# Patient Record
Sex: Female | Born: 1980 | Race: Black or African American | Hispanic: No | Marital: Married | Smoking: Current every day smoker
Health system: Southern US, Community
[De-identification: ages and names within clinical notes are randomized; demographics above are authoritative.]

## PROBLEM LIST (undated history)

## (undated) DIAGNOSIS — J40 Bronchitis, not specified as acute or chronic: Secondary | ICD-10-CM

## (undated) DIAGNOSIS — I10 Essential (primary) hypertension: Secondary | ICD-10-CM

## (undated) DIAGNOSIS — I499 Cardiac arrhythmia, unspecified: Secondary | ICD-10-CM

## (undated) HISTORY — PX: TUBAL LIGATION: SHX77

---

## 2001-11-27 ENCOUNTER — Inpatient Hospital Stay (HOSPITAL_COMMUNITY): Admission: AD | Admit: 2001-11-27 | Discharge: 2001-11-27 | Payer: Self-pay | Admitting: Obstetrics and Gynecology

## 2001-11-27 ENCOUNTER — Encounter: Payer: Self-pay | Admitting: Obstetrics and Gynecology

## 2003-09-20 ENCOUNTER — Ambulatory Visit (HOSPITAL_COMMUNITY): Admission: RE | Admit: 2003-09-20 | Discharge: 2003-09-20 | Payer: Self-pay | Admitting: Obstetrics & Gynecology

## 2003-11-10 ENCOUNTER — Inpatient Hospital Stay (HOSPITAL_COMMUNITY): Admission: AD | Admit: 2003-11-10 | Discharge: 2003-11-10 | Payer: Self-pay | Admitting: *Deleted

## 2003-12-08 ENCOUNTER — Inpatient Hospital Stay (HOSPITAL_COMMUNITY): Admission: RE | Admit: 2003-12-08 | Discharge: 2003-12-14 | Payer: Self-pay | Admitting: Obstetrics

## 2003-12-19 ENCOUNTER — Inpatient Hospital Stay (HOSPITAL_COMMUNITY): Admission: AD | Admit: 2003-12-19 | Discharge: 2003-12-29 | Payer: Self-pay | Admitting: Obstetrics

## 2003-12-19 ENCOUNTER — Encounter: Payer: Self-pay | Admitting: Obstetrics & Gynecology

## 2003-12-19 ENCOUNTER — Encounter: Admission: RE | Admit: 2003-12-19 | Discharge: 2003-12-19 | Payer: Self-pay | Admitting: Obstetrics and Gynecology

## 2004-01-10 ENCOUNTER — Inpatient Hospital Stay (HOSPITAL_COMMUNITY): Admission: AD | Admit: 2004-01-10 | Discharge: 2004-01-13 | Payer: Self-pay | Admitting: Obstetrics & Gynecology

## 2004-01-12 ENCOUNTER — Encounter (INDEPENDENT_AMBULATORY_CARE_PROVIDER_SITE_OTHER): Payer: Self-pay | Admitting: Specialist

## 2004-10-31 IMAGING — US US FETAL BPP W/O NONSTRESS
1 series · 12 of 28 positions shown · non-contrast
Comparison: none

CLINICAL DATA: Decreased amniotic fluid index with decels.  Assess growth and fetal well-being.

[Series 1: unknown · 0.32mm/px · 12 of 32 slices shown]
[im 2/32]
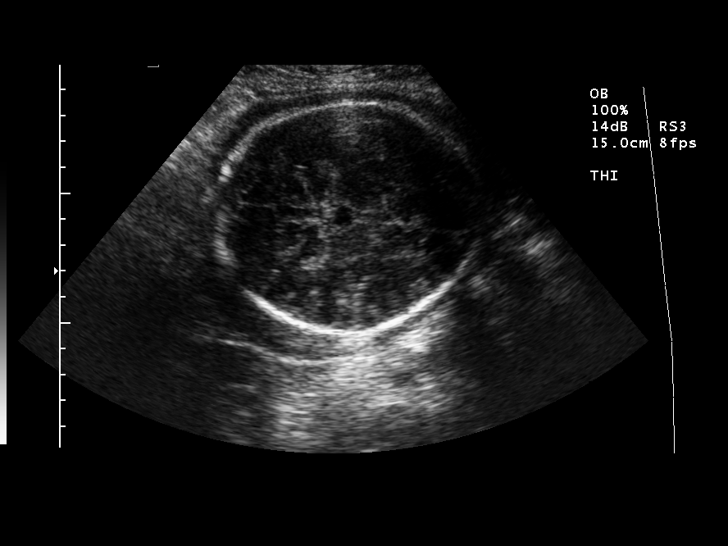
[im 4/32]
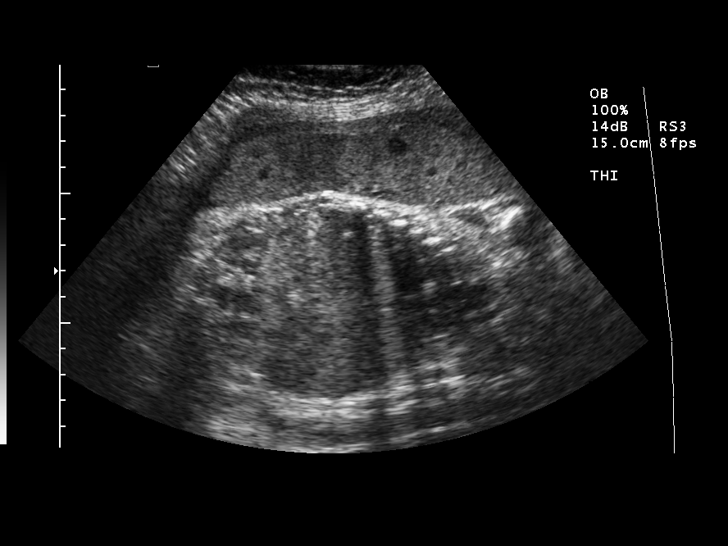
[im 6/32]
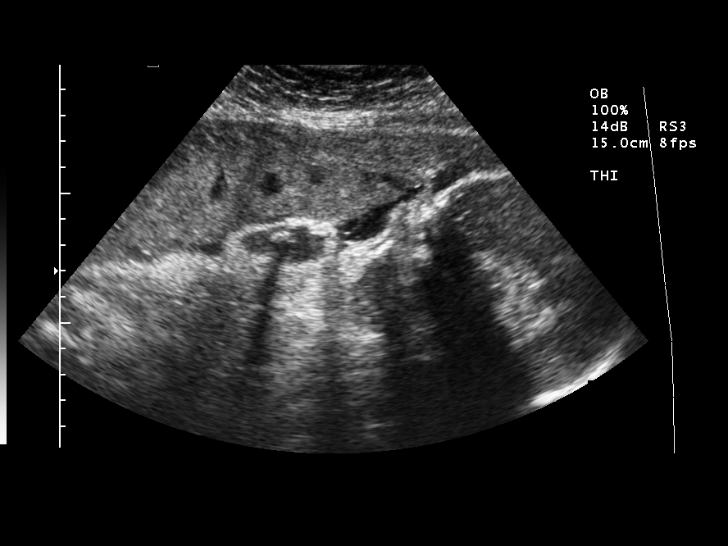
[im 10/32]
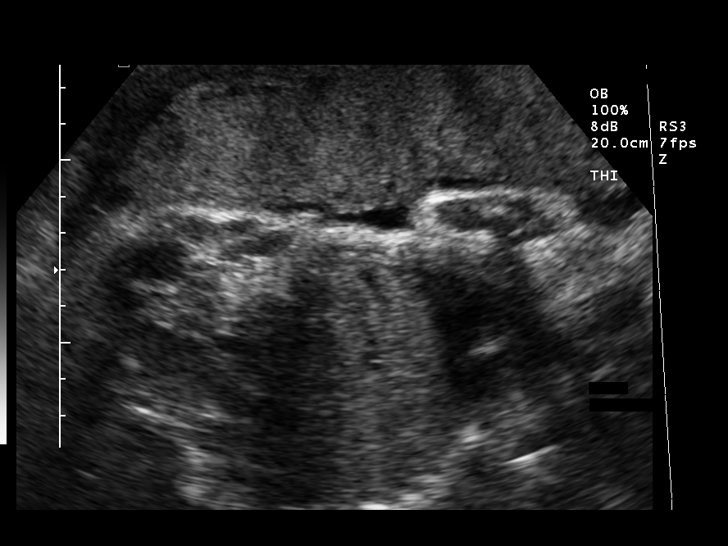
[im 12/32]
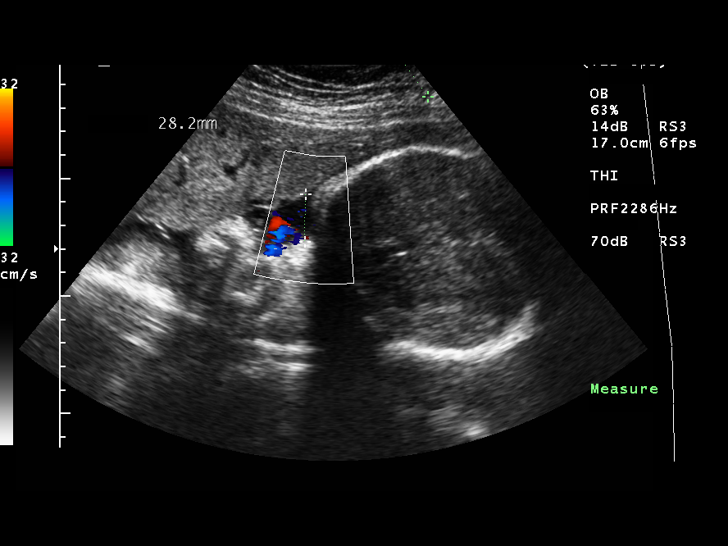
[im 14/32]
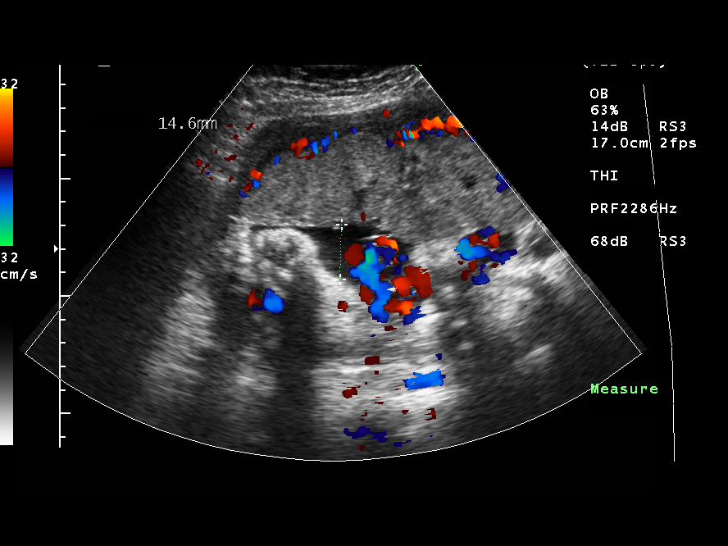
[im 18/32]
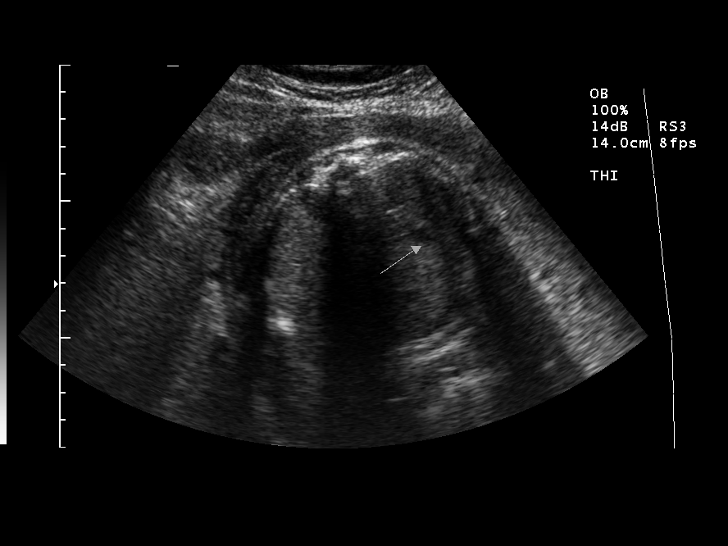
[im 20/32]
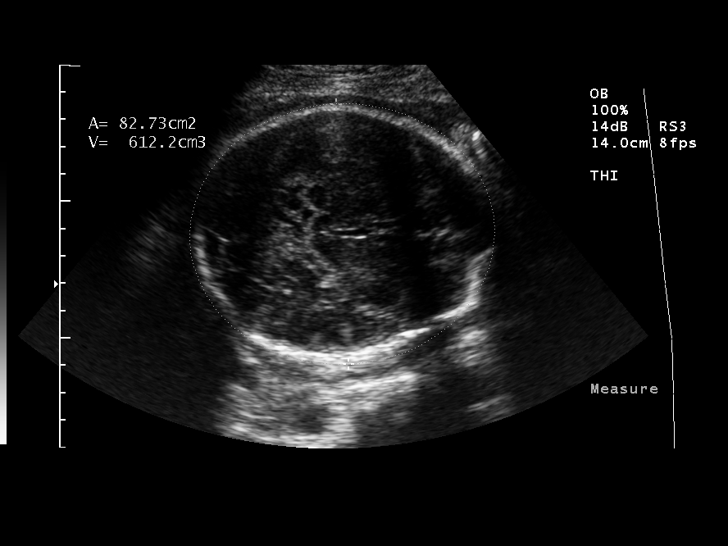
[im 22/32]
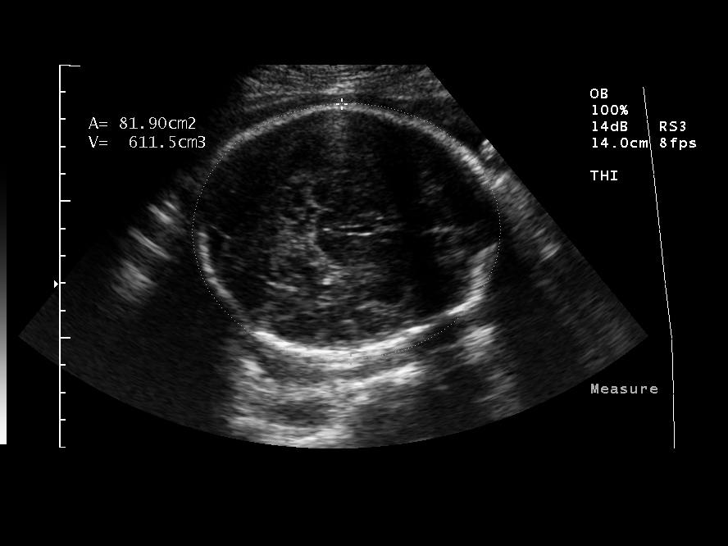
[im 26/32]
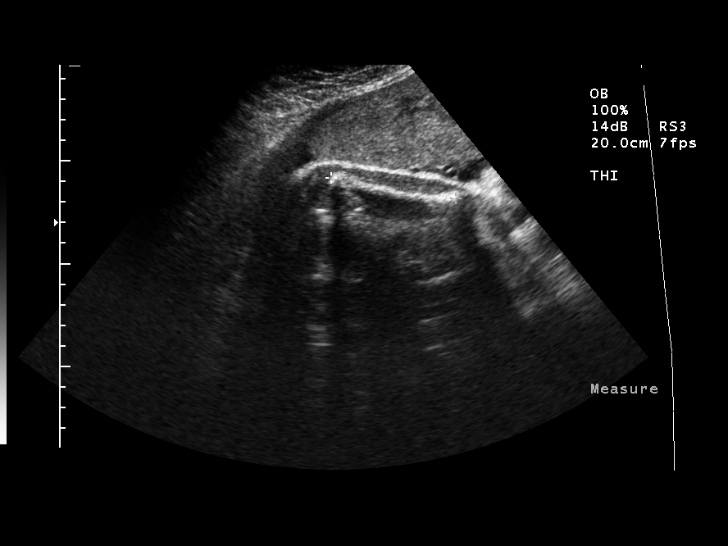
[im 28/32]
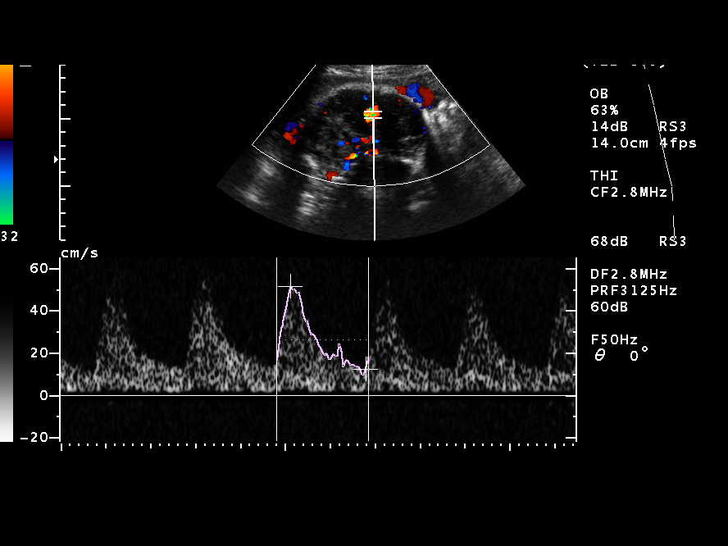
[im 30/32]
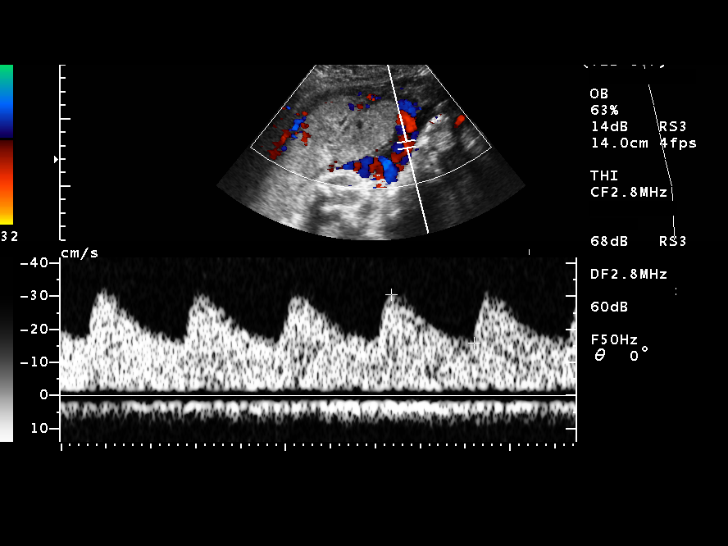

[12 of 28 positions shown; findings below may reference images not displayed]

OBSTETRICAL ULTRASOUND RE-EVALUATION
Number of Fetuses:  1
Heart Rate:  150
Movement:  Yes
Breathing:  Yes
Presentation:  Cephalic
Placental Location:  Anterior
Grade:  I
Previa:  No
Amniotic Fluid (subjective):  Decreased
Amniotic Fluid (objective):  7.3 cm AFI (5th - 95th%ile =   7.5 ? 24.4 cm for 37 wks)

FETAL BIOMETRY
BPD:  9.1 cm     36 w 5 d
HC:  32.4 cm   36 w 4 d
AC:  27.2 cm   31 w 2 d
FL:  6.1 cm   31 w 5 d

Mean GA:  34 w 1 d
Assigned GA:  37 w 1 d (Assigned)
BPD/OFD:  .84 (0.70 ? 0.86); FL/BPD:  .68 (0.71 ? 0.87); FL/AC:  (0.20 ? 0.24); HC/AC:  1.19 (0.92 ? 1.05)

EFW:  8818 g (S) 5th ? 10th%ile ( 3335 ? 4232 g) For 37 weeks

FETAL ANATOMY
Lateral Ventricles:  Visualized 
Thalami/CSP:  Visualized 
Posterior Fossa:  Previously seen 
Nuchal Region:  Previously seen 
Spine:  Previously seen 
4 Chamber Heart on Left:  Previously seen 
Stomach on Left:  Visualized 
3 Vessel Cord:  Previously seen 
Cord Insertion Site:  Previously seen 
Kidneys:  Visualized 
Bladder:  Visualized 
Extremities:  Previously seen 
MATERNAL FINDINGS
Cervix:  Not evaluated
BIOPHYSICAL PROFILE

Movement:  2    Time:  30 minutes
Breathing:  2
Tone: 2
Amniotic Fluid:  2

Total Score:  8

DOPPLER ULTRASOUND OF FETUS

Umbilical Artery S/D Ratio: 2.00    (NL< 3.23)

Middle Cerebral Artery PI:  1.30    (NL> 1.15)
IMPRESSION: Single intrauterine pregnancy demonstrating an estimated gestational age by ultrasound of 34 weeks 1 day.  The abdominal circumference is small in comparison with the head measurements with an absolute measurement of 27.2 cm (31 weeks 2 days).  The femur length is also small relative to the expected estimated gestational age by LMP of 37 weeks 1 day with an absolute measurement of 6.1 cm (31 weeks and 5 days).  The femur is measured at less than the 5th percentile for a 37 week gestation.  For this reason the Shgiwal technique was utilized to estimate weight and this places the estimated fetal weight at 8818 grams which is between the 5th and 10th percentile for a 37 week gestation.  The estimated fetal weight on the most recent growth exam was just at the 10th percentile and this represents a slight interval decrease in overall weight and would again correlate with a small-for-gestational-age fetus or symmetric intrauterine growth restriction.  The presence of a less than 5th percentile femoral length would justify further investigation.  As this study was performed on call measurements of the other long bones was not undertaken, but this would be recommended at subsequent follow-up exam to evaluate for the presence of global limb shortening.  
Subjectively and quantitatively decreased amniotic fluid volume.
No late developing fetal anatomic abnormalities are seen associated with the lateral ventricles, stomach, kidneys or bladder.  A hour chamber heart was not reevaluated today due to positioning.  
Normal fetal doppler evaluation.  
Biophysical profile score [DATE].

## 2008-04-25 ENCOUNTER — Emergency Department (HOSPITAL_COMMUNITY): Admission: EM | Admit: 2008-04-25 | Discharge: 2008-04-25 | Payer: Self-pay | Admitting: Emergency Medicine

## 2008-04-28 ENCOUNTER — Emergency Department (HOSPITAL_COMMUNITY): Admission: EM | Admit: 2008-04-28 | Discharge: 2008-04-28 | Payer: Self-pay | Admitting: Emergency Medicine

## 2009-01-08 ENCOUNTER — Emergency Department (HOSPITAL_COMMUNITY): Admission: EM | Admit: 2009-01-08 | Discharge: 2009-01-08 | Payer: Self-pay | Admitting: Emergency Medicine

## 2010-10-20 ENCOUNTER — Encounter: Payer: Self-pay | Admitting: Obstetrics & Gynecology

## 2011-02-14 NOTE — Discharge Summary (Signed)
NAME:  Melanie Castro, Melanie Castro                    ACCOUNT NO.:  [id number]   MEDICAL RECORD NO.:  [id number]                   PATIENT TYPE:  INP   LOCATION:  9154                                 FACILITY:  [facility]   PHYSICIAN:  Roseanna Rainbow, M.D.         DATE OF BIRTH:  Jan 18, 1981   DATE OF ADMISSION:  12/19/2003  DATE OF DISCHARGE:  12/29/2003                                 DISCHARGE SUMMARY   CHIEF COMPLAINT:  The patient is a 30 year old, para 3, with estimated date  of confinement Jan 30, 2004, with an intrauterine pregnancy at 34 weeks  admitted with borderline low amniotic fluid and a suspicious nonstress test.  Please see the dictated History and Physical for further details.   HOSPITAL COURSE:  The patient was admitted and initially placed on  continuous external fetal monitoring and bed rest.  The fetal surveillance  was then changed to non-stress test twice a day.  On April 1, an ultrasound  demonstrated an amniotic fluid index of 8.5 and estimated fetal weight  percentile of 10th to 25th percentile and biophysical profile of 8/8 and  normal Doppler studies.  The fetal heart tracing remained reassuring.  There  had not been any decelerations for 2 days prior to 4/1.  At this point, the  patient was discharged to home.  She was counseled regarding the importance  of twice daily kick counts and modified bed rest.  She was also counseled  regarding the importance of biweekly followup in the office with serial  fetal testing and ultrasounds.   DISCHARGE DIAGNOSIS:  Intrauterine pregnancy at 35-plus weeks with  borderline decreased fetal growth, borderline low amniotic fluid index.   CONDITION:  Stable.   DIET:  Regular.   ACTIVITY:  Modified bed rest.   MEDICATIONS:  Resume home medications.   DISPOSITION:  The patient was to follow up in the office in 2 days.                                               Roseanna Rainbow, M.D.    Melanie Castro  D:  01/28/2004   T:  01/29/2004  Job:  [id number]

## 2011-02-14 NOTE — Op Note (Signed)
NAME:  Melanie Castro, Melanie Castro                    ACCOUNT NO.:  [id number]   MEDICAL RECORD NO.:  [id number]                   PATIENT TYPE:  INP   LOCATION:  9102                                 FACILITY:  [facility]   PHYSICIAN:  Roseanna Rainbow, M.D.         DATE OF BIRTH:  10/14/1980   DATE OF PROCEDURE:  01/12/2004  DATE OF DISCHARGE:                                 OPERATIVE REPORT   PREOPERATIVE DIAGNOSIS:  Multiparity, desires sterilization.   POSTOPERATIVE DIAGNOSIS:  Multiparity, desires sterilization, right  paratubal cyst.   PROCEDURE:  Modified Pomeroy bilateral tubal ligation and excision of right  paratubal cyst.   SURGEON:  Roseanna Rainbow, M.D.   ANESTHESIA:  Spinal.   ESTIMATED BLOOD LOSS:  Less than 50 mL.   COMPLICATIONS:  None.   PROCEDURE:  The patient was taken to the operating room and spinal  anesthetic was administered without difficulty.  She was then placed in the  dorsal supine position and prepped and draped in the sterile fashion.  A  small infraumbilical incision was made with the scalpel and carried down to  the fascia.  The fascia was then incised and this incision was extended  bilaterally with Mayo scissors.  The parietal peritoneum was then grasped,  tented up, and entered sharply with Metzenbaum scissors.  The incision was  extended and the peritoneal incision was palpated and felt to be free of any  adhesions.  Army-[employer]y retractors were then placed into the incision.  The  patient's right fallopian tube was then grasped with Babcock clamps and  followed out to the fimbriated end.  Coming off the ampullary portion of the  tube was a multiloculated paratubal cyst.  The mid isthmic portion of the  tube was then grasped with the Babcock clamp.  Two ligatures of 0 plain were  placed and the intervening segment of tube excised.  The base of the  paratubal cyst was clamped with a hemostat.  The cyst was excised.  The base  was then suture  ligated with 2-0 Vicryl.  Excellent hemostasis was noted and  the tube was returned to the abdomen.  The left fallopian tube was then  grasped with Babcock clamps and followed out to the fimbriated end.  The mid  isthmic portion of the tube was then grasped with the Babcock clamp.  Two  ligatures of 0 plain were placed and the intervening segment of tube  excised.  Adequate hemostasis was noted and the tube was returned to the  abdomen.  The parietal peritoneum and fascia were closed in one layer using  0 Vicryl in a running fashion.  The skin was reapproximated with 4-0 Vicryl.  At the close of the procedure, the instrument and pack counts were said to  be correct x 2.  The patient was taken to the PACU awake and in stable  condition.  Roseanna Rainbow, M.D.    Judee Clara  D:  01/12/2004  T:  01/12/2004  Job:  [id number]

## 2011-02-14 NOTE — H&P (Signed)
NAME:  Melanie Castro, Melanie Castro                     ACCOUNT NO.:  [id number]   MEDICAL RECORD NO.:  [id number]                   PATIENT TYPE:  INP   LOCATION:  9154                                 FACILITY:  [facility]   PHYSICIAN:  Roseanna Rainbow, M.D.         DATE OF BIRTH:  1981/06/01   DATE OF ADMISSION:  12/19/2003  DATE OF DISCHARGE:                                HISTORY & PHYSICAL   REASON FOR ADMISSION:  The patient is a 30 year old para 3 with estimated  date of confinement Jan 30, 2004 with an intrauterine pregnancy at 34 weeks  admitted with borderline low amniotic fluid index and suspicious nonstress  test.   HISTORY OF PRESENT ILLNESS:  As above.  The patient had previously been  admitted with similar issues approximately 1 week ago.  She presented for a  biophysical profile and was found to have a BPP of 6/8, -2 for breathing.  She was then brought for a nonstress test and during the nonstress test had  a spontaneous prolonged deceleration with nadir in the 60s for 2-3 minutes.  Amniotic fluid index today was 7.1.   ANTEPARTUM COURSE, PROBLEMS, AND RISKS:  See above.  She has a history of  depression and pregnancy-induced hypertension with a previous pregnancy.  Most recent ultrasound on March 11 demonstrated an estimated fetal weight  percentile at the 10th percentile with abdominal circumference lag  consistent with asymmetrical intrauterine growth restriction.  Doppler  studies were normal.   LABORATORY WORK:  Hemoglobin and hematocrit 11.4 and 36.1; platelets  308,000.  Blood type A positive, Rh antibody negative.  Sickle cell trait  negative.  RPR nonreactive.  Rubella immune.  Hepatitis B surface antigen  negative.  HIV nonreactive.  Gonorrhea and chlamydia negative.  Urine  culture and sensitivity negative.   PAST OBSTETRICAL AND GYNECOLOGICAL HISTORY:  She is status post three SVDs  at term.  She has history of Trichomonas.   PAST MEDICAL HISTORY:  See above.   The patient has history of  pyelonephritis.   PAST SURGICAL HISTORY:  She denies.   FAMILY HISTORY:  Remarkable for chronic hypertension.   SOCIAL HISTORY:  She denies any tobacco, ethanol, or substance abuse.   ALLERGIES:  No known drug allergies.   MEDICATIONS:  Prenatal vitamins.   PHYSICAL EXAMINATION:  VITAL SIGNS:  Stable, afebrile.  Nonstress test - see  above.  ABDOMEN:  Nontender.  PELVIC:  Deferred.   ASSESSMENT:  Intrauterine pregnancy at 34 weeks with intrauterine growth  restriction borderline and low amniotic fluid index in a suspicious fetal  heart tracing.   PLAN:  1. Admission.  2. Continuous fetal monitoring.  3. Serial amniotic fluid index checks as well as Dopplers.  4. Bedrest.  Roseanna Rainbow, M.D.    Judee Clara  D:  12/20/2003  T:  12/20/2003  Job:  [id number]

## 2011-02-14 NOTE — Discharge Summary (Signed)
NAME:  Melanie Castro, Melanie Castro                    ACCOUNT NO.:  [id number]   MEDICAL RECORD NO.:  [id number]                   PATIENT TYPE:  INP   LOCATION:  9158                                 FACILITY:  [facility]   PHYSICIAN:  Roseanna Rainbow, M.D.         DATE OF BIRTH:  June 28, 1981   DATE OF ADMISSION:  12/08/2003  DATE OF DISCHARGE:  12/14/2003                                 DISCHARGE SUMMARY   CHIEF COMPLAINT:  The patient is a 30 year old gravida 4, para 3, with an  intrauterine pregnancy at 32 weeks and found to have an amniotic fluid index  of 8.   HISTORY OF PRESENT ILLNESS:  The patient has presented for an ultrasound for  interval growth.  The amniotic fluid index was found to be 8 and the  estimated fetal weight percentile found to be between the 10th and the 25th  percentile.   PAST SURGICAL HISTORY:  She denies.   PAST MEDICAL HISTORY:  Hypertension, heart murmur, sickle cell trait,  anxiety disorder, pyelonephritis.   MEDICATIONS:  Prenatal vitamins.   ALLERGIES:  No known drug allergies.   SOCIAL HISTORY:  She is single.  She denies any tobacco, ethanol, or  substance abuse.   PHYSICAL EXAMINATION:  VITAL SIGNS:  Stable, afebrile.  ABDOMEN:  Gravid, nontender.   ASSESSMENT:  Intrauterine pregnancy at 32-plus weeks, borderline low  amniotic fluid index and borderline decreased fetal growth.   PLAN:  Admission for bedrest.   HOSPITAL COURSE:  The patient was admitted.  The fetal heart tracing was  noted to have several prolonged decelerations for 2-3 minutes nadiring in  the 100s on March 15.  However, the fetal heart tracing strips were reactive  with good long-term variability.  On March 17, an ultrasound demonstrated  amniotic fluid index of 10.4 with normal Doppler studies.  She was  subsequently discharged to home.  She was counseled regarding the need for  twice daily kick counts and biweekly testing and to continue modified bed  rest.   DISCHARGE  DIAGNOSIS:  Intrauterine pregnancy at 33-plus weeks with  borderline decreased fetal growth and fetal heart tracing with intermittent  variable decelerations.   CONDITION:  Stable.   DIET:  Regular.   ACTIVITY:  Modified bedrest.   MEDICATIONS:  Resume home medications.   DISPOSITION:  The patient was to follow up in the office in 2 days.                                               Roseanna Rainbow, M.D.    Judee Clara  D:  01/28/2004  T:  01/29/2004  Job:  [id number]

## 2012-07-28 ENCOUNTER — Emergency Department (HOSPITAL_COMMUNITY)
Admission: EM | Admit: 2012-07-28 | Discharge: 2012-07-28 | Disposition: A | Discharge status: Home / Self Care | Payer: Self-pay | Attending: Emergency Medicine | Admitting: Emergency Medicine

## 2012-07-28 ENCOUNTER — Emergency Department (HOSPITAL_COMMUNITY): Payer: Self-pay

## 2012-07-28 ENCOUNTER — Encounter (HOSPITAL_COMMUNITY): Payer: Self-pay | Admitting: Emergency Medicine

## 2012-07-28 DIAGNOSIS — I10 Essential (primary) hypertension: Secondary | ICD-10-CM | POA: Insufficient documentation

## 2012-07-28 DIAGNOSIS — R1110 Vomiting, unspecified: Secondary | ICD-10-CM | POA: Insufficient documentation

## 2012-07-28 DIAGNOSIS — J45909 Unspecified asthma, uncomplicated: Secondary | ICD-10-CM | POA: Insufficient documentation

## 2012-07-28 DIAGNOSIS — R51 Headache: Secondary | ICD-10-CM | POA: Insufficient documentation

## 2012-07-28 DIAGNOSIS — J069 Acute upper respiratory infection, unspecified: Secondary | ICD-10-CM | POA: Insufficient documentation

## 2012-07-28 DIAGNOSIS — R109 Unspecified abdominal pain: Secondary | ICD-10-CM | POA: Insufficient documentation

## 2012-07-28 DIAGNOSIS — IMO0001 Reserved for inherently not codable concepts without codable children: Secondary | ICD-10-CM | POA: Insufficient documentation

## 2012-07-28 DIAGNOSIS — Z79899 Other long term (current) drug therapy: Secondary | ICD-10-CM | POA: Insufficient documentation

## 2012-07-28 DIAGNOSIS — F17200 Nicotine dependence, unspecified, uncomplicated: Secondary | ICD-10-CM | POA: Insufficient documentation

## 2012-07-28 HISTORY — DX: Essential (primary) hypertension: I10

## 2012-07-28 MED ORDER — HYDROCOD POLST-CHLORPHEN POLST 10-8 MG/5ML PO LQCR: 5.0000 mL | Freq: Two times a day (BID) | ORAL | Status: DC

## 2012-07-28 MED ORDER — IBUPROFEN 800 MG PO TABS
800.0000 mg | ORAL_TABLET | Freq: Once | ORAL | Status: AC
  Administered 2012-07-28: 800 mg via ORAL
  Filled 2012-07-28: qty 1

## 2012-07-28 MED ORDER — GUAIFENESIN ER 600 MG PO TB12: 1200.0000 mg | ORAL_TABLET | Freq: Two times a day (BID) | ORAL | Status: DC

## 2012-07-28 MED ORDER — ALBUTEROL SULFATE HFA 108 (90 BASE) MCG/ACT IN AERS: 1.0000 | INHALATION_SPRAY | Freq: Four times a day (QID) | RESPIRATORY_TRACT | Status: DC | PRN

## 2012-07-28 MED ORDER — ALBUTEROL SULFATE (5 MG/ML) 0.5% IN NEBU: 2.5000 mg | INHALATION_SOLUTION | Freq: Once | RESPIRATORY_TRACT | Status: DC

## 2012-07-28 MED ORDER — IBUPROFEN 800 MG PO TABS: 800.0000 mg | ORAL_TABLET | Freq: Three times a day (TID) | ORAL | Status: DC | PRN

## 2012-07-28 MED ORDER — ALBUTEROL SULFATE HFA 108 (90 BASE) MCG/ACT IN AERS
2.0000 | INHALATION_SPRAY | Freq: Once | RESPIRATORY_TRACT | Status: AC
  Administered 2012-07-28: 2 via RESPIRATORY_TRACT
  Filled 2012-07-28: qty 6.7

## 2012-07-28 NOTE — ED Provider Notes (Addendum)
History     CSN: [id number]  Arrival date & time 07/28/12  4098   First MD Initiated Contact with Patient 07/28/12 [phone]      Chief Complaint  Patient presents with  . Cough  . Emesis    (Consider location/radiation/quality/duration/timing/severity/associated sxs/prior treatment) HPI Comments: Melanie Castro 31 y.o. female   The chief complaint is: Patient presents with:   Cough   Emesis    Awoke yesterday with, cough, runny nose , cp,  Post- tussive vomiting, headache. Took alka seltzer plus cold and benadryl without relief.  + elevated temperature, myalgias, arthralgias.  Non-productive cough.  +chest congestion. Ear pain. + muscular soreness chest and abdomen Denies, sore throat. Otalgia,   Patient is a 31 y.o. female presenting with cough and vomiting. The history is provided by the patient. No language interpreter was used.  Cough Associated symptoms include chest pain (wiith cough), chills, headaches, rhinorrhea, myalgias and wheezing. Pertinent negatives include no shortness of breath.  Emesis  Associated symptoms include abdominal pain, arthralgias, chills, cough, a fever, headaches and myalgias.    Past Medical History  Diagnosis Date  . Hypertension     Past Surgical History  Procedure Date  . Tubal ligation     Family History  Problem Relation Age of Onset  . Hypertension Mother   . Diabetes Other     History  Substance Use Topics  . Smoking status: Current Every Day Smoker -- 2.0 packs/day    Types: Cigarettes  . Smokeless tobacco: Not on file  . Alcohol Use: Yes    OB History    Grav Para Term Preterm Abortions TAB SAB Ect Mult Living                  Review of Systems  Constitutional: Positive for fever, chills, activity change and appetite change. Negative for diaphoresis.  HENT: Positive for congestion and rhinorrhea. Negative for trouble swallowing, dental problem, voice change and sinus pressure.   Respiratory: Positive  for cough, chest tightness and wheezing. Negative for shortness of breath.   Cardiovascular: Positive for chest pain (wiith cough).  Gastrointestinal: Positive for vomiting and abdominal pain.  Genitourinary: Negative.   Musculoskeletal: Positive for myalgias and arthralgias.  Skin: Negative for rash.  Neurological: Positive for headaches. Negative for seizures, syncope and light-headedness.    Allergies  Amoxicillin and Penicillins  Home Medications   Current Outpatient Rx  Name Route Sig Dispense Refill  . ALKA-SELTZER PLUS COLD PO Oral Take 1 tablet by mouth every 6 (six) hours.    Marland Kitchen DIPHENHYDRAMINE HCL 25 MG PO CAPS Oral Take 25 mg by mouth every 6 (six) hours as needed. allergies    . HALLS COUGH DROPS 5 MG MT LOZG Mouth/Throat Use as directed 1 lozenge in the mouth or throat as needed. cough      BP 134/83  Pulse 100  Temp 100.2 F (37.9 C) (Oral)  Resp 26  SpO2 99%  LMP 07/21/2012  Physical Exam  Nursing note and vitals reviewed. Constitutional: She is oriented to person, place, and time. No distress.       Ill appearing female, glassy eyes. Warm to touch.  HENT:  Head: Normocephalic.       Mild pharyngeal erythema, no swelling or exudates.   Eyes: Conjunctivae normal are normal.  Neck: Normal range of motion. Neck supple. No tracheal deviation present. No thyromegaly present.  Cardiovascular: Regular rhythm, normal heart sounds and intact distal pulses.  Broderline tachycardia. Patient is febrile.   Pulmonary/Chest: Effort normal.       Mild tachypnea. Normal effort.  02 sats 100% RA on monitor.  Mild expiratory wheezes. Normal expiratory phase.  Abdominal: Soft. Bowel sounds are normal. She exhibits no distension and no mass. There is no tenderness. There is no guarding.  Musculoskeletal: Normal range of motion.  Lymphadenopathy:    She has cervical adenopathy.  Neurological: She is alert and oriented to person, place, and time.  Skin: Skin is warm and  dry. No rash noted. She is not diaphoretic.    ED Course  Procedures (including critical care time)  Labs Reviewed - No data to display Dg Chest 2 View  07/28/2012  *RADIOLOGY REPORT*  Clinical Data: Cough, shortness of breath, chest pain.  CHEST - 2 VIEW  Comparison: None.  Findings: Heart and mediastinal contours are within normal limits. No focal opacities or effusions.  No acute bony abnormality.  IMPRESSION: No active cardiopulmonary disease.   Original Report Authenticated By: Cyndie Chime, M.D.      1. URI (upper respiratory infection)   2. Reactive airway disease       MDM  Patient with sxs consistent with viral URI. cxr negative. i will discharge with albuterol inhaler and  supportive care. Discussed return precautions. Discussed reasons to seek immediate care. Patient expresses understanding and agrees with plan.          Arthor Captain, PA-C 07/28/12 1649  Arthor Captain, PA-C 08/10/12 1041

## 2012-07-28 NOTE — ED Notes (Signed)
Pt states that she is congested and has a dry cough since yesterday.  States that she was coughing so much that she has been throwing up.  Has been taking vicks, benadryl, and alkaseltzer.

## 2012-07-29 NOTE — ED Provider Notes (Signed)
Medical screening examination/treatment/procedure(s) were performed by non-physician practitioner and as supervising physician I was immediately available for consultation/collaboration.   Celene Kras, MD 07/29/12 [phone]

## 2013-05-16 ENCOUNTER — Emergency Department (HOSPITAL_COMMUNITY): Payer: Self-pay

## 2013-05-16 ENCOUNTER — Emergency Department (HOSPITAL_COMMUNITY)
Admission: EM | Admit: 2013-05-16 | Discharge: 2013-05-16 | Disposition: A | Discharge status: Home / Self Care | Payer: Self-pay | Attending: Emergency Medicine | Admitting: Emergency Medicine

## 2013-05-16 ENCOUNTER — Encounter (HOSPITAL_COMMUNITY): Payer: Self-pay | Admitting: Emergency Medicine

## 2013-05-16 DIAGNOSIS — I10 Essential (primary) hypertension: Secondary | ICD-10-CM | POA: Insufficient documentation

## 2013-05-16 DIAGNOSIS — Z880 Allergy status to penicillin: Secondary | ICD-10-CM | POA: Insufficient documentation

## 2013-05-16 DIAGNOSIS — F17200 Nicotine dependence, unspecified, uncomplicated: Secondary | ICD-10-CM | POA: Insufficient documentation

## 2013-05-16 DIAGNOSIS — R0789 Other chest pain: Secondary | ICD-10-CM

## 2013-05-16 DIAGNOSIS — Z79899 Other long term (current) drug therapy: Secondary | ICD-10-CM | POA: Insufficient documentation

## 2013-05-16 DIAGNOSIS — R071 Chest pain on breathing: Secondary | ICD-10-CM | POA: Insufficient documentation

## 2013-05-16 MED ORDER — IBUPROFEN 800 MG PO TABS: 800.0000 mg | ORAL_TABLET | Freq: Three times a day (TID) | ORAL | Status: DC

## 2013-05-16 MED ORDER — IBUPROFEN 800 MG PO TABS
800.0000 mg | ORAL_TABLET | Freq: Once | ORAL | Status: AC
  Administered 2013-05-16: 800 mg via ORAL
  Filled 2013-05-16: qty 1

## 2013-05-16 NOTE — ED Provider Notes (Signed)
CSN: [id number]     Arrival date & time 05/16/13  0636 History     First MD Initiated Contact with Patient 05/16/13 [phone]     Chief Complaint  Patient presents with  . Flank Pain   (Consider location/radiation/quality/duration/timing/severity/associated sxs/prior Treatment) Patient is a 32 y.o. female presenting with flank pain and chest pain.  Flank Pain Associated symptoms include chest pain. Pertinent negatives include no abdominal pain.  Chest Pain Pain location:  L lateral chest Pain quality: sharp   Pain quality: not aching and not burning   Pain radiates to:  Does not radiate Pain radiates to the back: no   Pain severity:  Mild Onset quality:  Gradual Duration:  2 weeks Timing:  Intermittent Progression:  Unchanged Chronicity:  New Context: breathing (deep breaths)   Context comment:  Twisting, palpation Relieved by:  Nothing Worsened by:  Nothing tried Ineffective treatments:  None tried Associated symptoms: no abdominal pain, no back pain, no fever, no lower extremity edema, no nausea, no syncope and not vomiting   Risk factors: hypertension and smoking   Risk factors: no birth control, no diabetes mellitus, not female and no prior DVT/PE     Past Medical History  Diagnosis Date  . Hypertension    Past Surgical History  Procedure Laterality Date  . Tubal ligation     Family History  Problem Relation Age of Onset  . Hypertension Mother   . Diabetes Other    History  Substance Use Topics  . Smoking status: Current Every Day Smoker -- 2.00 packs/day    Types: Cigarettes  . Smokeless tobacco: Not on file  . Alcohol Use: Yes   OB History   Grav Para Term Preterm Abortions TAB SAB Ect Mult Living                 Review of Systems  Constitutional: Negative for fever.  Cardiovascular: Positive for chest pain. Negative for syncope.  Gastrointestinal: Negative for nausea, vomiting and abdominal pain.  Genitourinary: Positive for flank pain.   Musculoskeletal: Negative for back pain.  All other systems reviewed and are negative.    Allergies  Amoxicillin and Penicillins  Home Medications   Current Outpatient Rx  Name  Route  Sig  Dispense  Refill  . albuterol (PROVENTIL HFA;VENTOLIN HFA) 108 (90 BASE) MCG/ACT inhaler   Inhalation   Inhale 1-2 puffs into the lungs every 6 (six) hours as needed for wheezing.   1 Inhaler   0   . Chlorphen-Phenyleph-ASA (ALKA-SELTZER PLUS COLD PO)   Oral   Take 1 tablet by mouth every 6 (six) hours.         . chlorpheniramine-HYDROcodone (TUSSIONEX) 10-8 MG/5ML LQCR   Oral   Take 5 mLs by mouth every 12 (twelve) hours.   140 mL   o   . diphenhydrAMINE (BENADRYL) 25 mg capsule   Oral   Take 25 mg by mouth every 6 (six) hours as needed. allergies         . guaiFENesin (MUCINEX) 600 MG 12 hr tablet   Oral   Take 2 tablets (1,200 mg total) by mouth 2 (two) times daily.   20 tablet   0   . ibuprofen (ADVIL,MOTRIN) 800 MG tablet   Oral   Take 1 tablet (800 mg total) by mouth every 8 (eight) hours as needed for pain.   15 tablet   0   . Throat Lozenges (HALLS COUGH DROPS) 5 MG LOZG   Mouth/Throat  Use as directed 1 lozenge in the mouth or throat as needed. cough          BP 142/93  Pulse 71  Temp(Src) 98.2 F (36.8 C) (Oral)  Resp 18  SpO2 100%  LMP 05/05/2013 Physical Exam  Nursing note and vitals reviewed. Constitutional: She is oriented to person, place, and time. She appears well-developed and well-nourished. No distress.  HENT:  Head: Normocephalic and atraumatic.  Eyes: EOM are normal. Pupils are equal, round, and reactive to light.  Neck: Normal range of motion. Neck supple.  Cardiovascular: Normal rate and regular rhythm.  Exam reveals no friction rub.   No murmur heard. Pulmonary/Chest: Effort normal and breath sounds normal. No respiratory distress. She has no wheezes. She has no rales. She exhibits tenderness (L lateral, point tenderness at nipple  line in L lateral chest).  Abdominal: Soft. She exhibits no distension. There is no tenderness. There is no rebound.  Musculoskeletal: Normal range of motion. She exhibits no edema.  Neurological: She is alert and oriented to person, place, and time.  Skin: She is not diaphoretic.    ED Course   Procedures (including critical care time)  Labs Reviewed - No data to display Dg Chest 2 View  05/16/2013   *RADIOLOGY REPORT*  Clinical Data: Left side pain.  CHEST - 2 VIEW  Comparison: PA and lateral chest 07/28/2012.  Findings: The lungs are clear.  No pneumothorax or pleural effusion.  Heart size is normal.  No focal bony abnormality.  IMPRESSION: Negative chest.   Original Report Authenticated By: Holley Dexter, M.D.   1. Chest wall pain     MDM  68F with L sided chest. Worse with deep breaths, movement, palpation. No trauma. AFVSS here. L lateral chest tenderness to palpation. PERC negative. Likely musculoskeletal. Will check CXR and give NSAIDs.  CXR normal, reviewed by me. Given Rx for NSAIDs and instructed to f/u with PCP.   Dagmar Hait, MD 05/16/13 [phone]

## 2013-05-16 NOTE — ED Notes (Signed)
Pt arrived to the ED from home via POV with a complaint of rib pain.  Pt is complaining of left sided rib pain for the last 2 weeks.  Pt states it hurts upon inhalation.

## 2015-04-22 ENCOUNTER — Emergency Department (HOSPITAL_COMMUNITY): Payer: Medicaid Other

## 2015-04-22 ENCOUNTER — Encounter (HOSPITAL_COMMUNITY): Payer: Self-pay | Admitting: Emergency Medicine

## 2015-04-22 ENCOUNTER — Emergency Department (HOSPITAL_COMMUNITY)
Admission: EM | Admit: 2015-04-22 | Discharge: 2015-04-22 | Discharge status: Against Medical Advice | Payer: Medicaid Other | Attending: Emergency Medicine | Admitting: Emergency Medicine

## 2015-04-22 DIAGNOSIS — R002 Palpitations: Secondary | ICD-10-CM | POA: Diagnosis not present

## 2015-04-22 DIAGNOSIS — Z720 Tobacco use: Secondary | ICD-10-CM | POA: Insufficient documentation

## 2015-04-22 DIAGNOSIS — R0602 Shortness of breath: Secondary | ICD-10-CM | POA: Insufficient documentation

## 2015-04-22 DIAGNOSIS — I10 Essential (primary) hypertension: Secondary | ICD-10-CM | POA: Insufficient documentation

## 2015-04-22 NOTE — ED Notes (Signed)
Pt not in lobby upon calling to Acute room.

## 2015-04-22 NOTE — ED Notes (Signed)
Pt was offered a wheelchair to go to Xray however, pt states "I'm fine, nobody needs to treat me like I'm crippled". Pt upset about being sent back to lobby. Pt was made aware that there are no available rooms currently. Will continue to monitor.

## 2015-04-22 NOTE — ED Notes (Signed)
Pt from home c/o shortness of breath and "heart skipping beats since this a.m. She reports using her inhaler more but not feeling better.  Denies productive cough or fever.

## 2015-04-22 NOTE — ED Notes (Signed)
Attempted to call patient once again. No one in lobby. Registration reports they saw her leave.

## 2015-10-28 ENCOUNTER — Emergency Department (HOSPITAL_COMMUNITY)
Admission: EM | Admit: 2015-10-28 | Discharge: 2015-10-28 | Disposition: A | Discharge status: Home / Self Care | Payer: Medicaid Other | Attending: Emergency Medicine | Admitting: Emergency Medicine

## 2015-10-28 ENCOUNTER — Encounter (HOSPITAL_COMMUNITY): Payer: Self-pay | Admitting: *Deleted

## 2015-10-28 DIAGNOSIS — Z79899 Other long term (current) drug therapy: Secondary | ICD-10-CM | POA: Insufficient documentation

## 2015-10-28 DIAGNOSIS — R05 Cough: Secondary | ICD-10-CM | POA: Diagnosis present

## 2015-10-28 DIAGNOSIS — Z87891 Personal history of nicotine dependence: Secondary | ICD-10-CM | POA: Diagnosis not present

## 2015-10-28 DIAGNOSIS — J069 Acute upper respiratory infection, unspecified: Secondary | ICD-10-CM

## 2015-10-28 DIAGNOSIS — I10 Essential (primary) hypertension: Secondary | ICD-10-CM | POA: Diagnosis not present

## 2015-10-28 HISTORY — DX: Bronchitis, not specified as acute or chronic: J40

## 2015-10-28 MED ORDER — ALBUTEROL SULFATE HFA 108 (90 BASE) MCG/ACT IN AERS: 1.0000 | INHALATION_SPRAY | Freq: Four times a day (QID) | RESPIRATORY_TRACT | Status: AC | PRN

## 2015-10-28 MED ORDER — HYDROCODONE-HOMATROPINE 5-1.5 MG/5ML PO SYRP: 5.0000 mL | ORAL_SOLUTION | Freq: Four times a day (QID) | ORAL | Status: DC | PRN

## 2015-10-28 NOTE — Discharge Instructions (Signed)
1. Medications: flonase, mucinex, cough syrup as needed- This can make you very drowsy - please do not drink or drive on this medication, usual home medications 2. Treatment: rest, drink plenty of fluids, take tylenol or ibuprofen for fever control 3. Follow Up: Please follow up with your primary doctor in 3 days for discussion of your diagnoses and further evaluation after today's visit; if you do not have a primary care doctor use the resource guide provided to find one; Return to the ER for high fevers, difficulty breathing or other concerning symptoms   Emergency Department Resource Guide 1) Find a Doctor and Pay Out of Pocket Although you won't have to find out who is covered by your insurance plan, it is a good idea to ask around and get recommendations. You will then need to call the office and see if the doctor you have chosen will accept you as a new patient and what types of options they offer for patients who are self-pay. Some doctors offer discounts or will set up payment plans for their patients who do not have insurance, but you will need to ask so you aren't surprised when you get to your appointment.  2) Contact Your Local Health Department Not all health departments have doctors that can see patients for sick visits, but many do, so it is worth a call to see if yours does. If you don't know where your local health department is, you can check in your phone book. The CDC also has a tool to help you locate your state's health department, and many state websites also have listings of all of their local health departments.  3) Find a Walk-in Clinic If your illness is not likely to be very severe or complicated, you may want to try a walk in clinic. These are popping up all over the country in pharmacies, drugstores, and shopping centers. They're usually staffed by nurse practitioners or physician assistants that have been trained to treat common illnesses and complaints. They're usually  fairly quick and inexpensive. However, if you have serious medical issues or chronic medical problems, these are probably not your best option.  No Primary Care Doctor: - Call Health Connect at  [phone] - they can help you locate a primary care doctor that  accepts your insurance, provides certain services, etc. - Physician Referral Service- [phone]  Chronic Pain Problems: Organization         Address  Phone   Notes  [facility]  [phone] Patients need to be referred by their primary care doctor.   Medication Assistance: Organization         Address  Phone   Notes  [facility] Medication [facility] [address] [place], [place] [address] [phone] --Must be a resident of [facility] -- Must have NO insurance coverage whatsoever (no Medicaid/ Medicare, etc.) -- The pt. MUST have a primary care doctor that directs their care regularly and follows them in the community   Med[facility]ist  [phone]   [employer]  [phone]    Agencies that provide inexpensive medical care: Organization         Address  Phone   Notes  [facility]  [phone]   [facility]    [phone]   [facility] [address] [place], [place] [address] [phone]   [facility] [address] [place], [place] [phone]  Planned Parenthood    [phone]   [facility]    [phone]   Community Health and [place] [address], [place] Phone:  [phone], Fax:  [phone] Hours of Operation:  9 am - 6 pm, M-F.  Also accepts Medicaid/Medicare and self-pay.  [facility] for [place], [address], [place] Phone: [phone], Fax: [phone]. Hours of Operation:  8:30 am - 5:30 pm, M-F.  Also accepts Medicaid and self-pay.  [facility] [place] [address] [place] Phone: [phone]   [place], [place], [place] [phone], Ext. [phone] Mondays & Thursdays: 7-9 AM.  First 15 patients are seen on a first come, first serve basis.    [place] Providers:  Organization         Address  Phone   Notes  [facility] [address], [place] [phone] Also accepts self-pay patients.  [facility] [id number] [place], [place]  [phone]   [place], [address], [place] [phone]   [facility] [address] [place] [phone]   Lucianne Lei [address], [place]   [phone] Only accepts [place] Access [place] patients after they have their name applied to their card.   Self-Pay (no insurance) in [facility]:  Organization         Address  Phone   Notes  Sickle Cell Patients, [facility] [place] [phone]   [facility] [place] [phone]   [facility] [place]  [place], [place], [place] [phone]   [facility]/Dr. Osei-Bonsu  [address] [place] or [address], [place] [phone] Phone number for both [place] and [place] locations is the same.  Urgent Medical and [facility] [address], [place] [phone]   [facility] [address] [place] or [address] [phone]   [facility] [address] [place] [phone], phone; [phone], fax Sees patients 1st and 3rd Saturday of every month.  Must not qualify for public or private insurance (i.e. Medicaid, Medicare, [place] Health Choice, Veterans' Benefits)  Household income should be no more than 200% of the poverty level The clinic cannot treat you if  you are pregnant or think you are pregnant  Sexually transmitted diseases are not treated at the clinic.    Dental Care: Organization         Address  Phone  Notes  [facility] [place] [phone] Accepts children up to age 50 who are enrolled in [place] or [place]; pregnant women with a Medicaid card; and children who have applied for Medicaid or [place] Health Choice, but were declined, whose parents can pay a reduced fee at time of service.  [facility] of [facility]  [address], [place] [phone] Accepts children up to age 40 who are enrolled in [place] or [place]; pregnant women with a Medicaid card; and children who have applied for Medicaid or [place], but were declined,  whose parents can pay a reduced fee at time of service.  [place] Adult Dental Access PROGRAM  [place] [phone] Patients are seen by appointment only. Walk-ins are not accepted. [place] will see patients 31 years of age and older. Monday - Tuesday (8am-5pm) Most Wednesdays (8:30-5pm) $30 per visit, cash only  [facility] Adult Dental Access PROGRAM  [address], [facility] [phone] Patients are seen by appointment only. Walk-ins are not accepted. [place] will see patients 40 years of age and older. One Wednesday Evening (Monthly: Volunteer Based).  $30 per visit, cash only  [place]  [phone] for adults; Children under age 40, call Graduate Pediatric Dentistry at [phone]. Children aged 48-14, please call [phone] to request a pediatric application.  Dental services are provided in all areas of dental care including fillings, crowns and bridges, complete and partial dentures, implants, gum treatment, root canals, and extractions. Preventive care is also provided. Treatment is  provided to both adults and children. Patients are selected via a lottery and there is often a waiting list.   [facility] [address], [place]  [phone] [email or web address]   [facility] [address] [place], [place] [phone], Ext. [phone] Second and Fourth Thursday of each month, opens at 6:30 AM; Clinic ends at 9 AM.  Patients are seen on a first-come first-served basis, and a limited number are seen during each clinic.   [facility]  [address] Danker [place], [place] [phone]   Eligibility Requirements You must have lived in [place], [place], or [place] counties for at least the last three months.   You cannot be eligible for state or federal sponsored [employer], including [employer], [place], or [employer].   You generally cannot be eligible for healthcare insurance through your employer.    How to apply: Eligibility screenings are held every Tuesday and Wednesday afternoon from 1:00 pm until 4:00 pm. You do not need an appointment for the interview!  [facility] [address], [place], [place]   [place]  [place] Department  [place]  [phone]    Behavioral Health Resources in the Community: Intensive Outpatient Programs Organization         Address  Phone  Notes  [place] Chapman. [address], [place], [place] [phone]   [facility] Outpatient [address], [place], [place]   ADS: [facility] Drug [facility] [address], [place], [place]   [place] [address]  [place], [place] or [phone]   Substance Abuse Resources Organization         Address  Phone  Notes  Alcohol and Drug Services  [phone]   [place]  [phone]   The  [place]   Chinita Pester  [phone]   Residential & Outpatient Substance Abuse Program  [phone]   Psychological Services Organization         Address  Phone  Notes  [facility] [place]  [place]  [phone]   [place] [address], [place] or [phone]    Mobile Crisis Teams Organization         Address  Phone  Notes  [facility], Mobile  Crisis Care Unit  [phone]   [facility]  [address] [place], [place] [phone]   [facility] [address] [place] [phone]    Self-Help/Support Groups Organization         Address  Phone             Notes  [facility] [place] - variety of support groups  [phone]- I7437963 Call for more information  Narcotics Anonymous (NA), [facility] [address], [employer] [place]  2 meetings at this location   Statistician         Address  Phone  Notes  [facility] [address],    [place]  [phone]   [facility]  [address] [place] [address], [place], [place] [phone]   [facility] [address] [place], [place] [phone] Admissions: 8am-3pm M-F  [facility] [address]    [place], [place] [phone]   [facility] [address] [place], [place], [place] [phone]   [facility] [address],  [place], [place] [phone]   [facility] - Intensive Outpatient [address], [place], [place] [phone]   [facility] (Addiction Recovery [facility] [address] [place].,  [place], [place] [phone] or [phone]   [facility] ([facility]) [address], [place], [place] [phone] Accepts Medicaid  [facility] [place] [address]  [place] [phone] Substance Abuse/Addiction Treatment     [facility] Organization         Address  Phone  Notes  [facility]  [phone]   Angie Fava, PhD [address] [place], [place]   [phone] or [phone]   [facility] Behavioral   [address] [place], [place] [phone]   [facility] [address] [place], [place] [phone] Insurance/Medicaid/sponsorship through [facility] and Families [address]                                    [place], [place] [phone] Therapy/tele-psych/case  [facility] [address], [place] [phone]    Dr. Lolly Mustache  [phone]   [facility]  [employer] [facility] Dept. 1) [address] [place] 2) [address] [place] 3)  [address], [place] [phone]  [phone]   [facility] Child Abuse Hotline 830-851-7978 or [phone] (After Hours)

## 2015-10-28 NOTE — ED Notes (Addendum)
Patient states she has had a cough that is so forceful she sometimes vomits x3 days.  Patient also c/o nasal congestion and itchy ears.  Patient states her cough is so forceful that her throat and chest hurt with cough.  Patient states she has had a fever over past 3 days, Tmax 100.  Patient has not used any decongestants or cough medicine, but did try some Tylenol for pain/fever.  Patient states her last cigarette was @ 3 months ago. Patient states she is out of her albuterol inhaler.  Patient states the inhaler made her feel some better and states neb treatments generally help her.

## 2015-10-28 NOTE — ED Provider Notes (Signed)
CSN: [id number]     Arrival date & time 10/28/15  1812 History  By signing my name below, I, Octavia Heir, attest that this documentation has been prepared under the direction and in the presence of [employer], PA-C. Electronically Signed: Octavia Heir, ED Scribe. 10/28/2015. 7:20 PM.    Chief Complaint  Patient presents with  . Cough     The history is provided by the patient. No language interpreter was used.   HPI Comments: Melanie Castro is a 35 y.o. female who has a hx of HTN and bronchitis who presents to the Emergency Department complaining of constant, gradual worsening dry cough onset three days ago. She has been having associated fever (tmax 100) and three episodes of post-tussive emesis. She states feeling short of breath when walking up the steps and feeling increased pain when taking a deep breath. She reports her cough is worse at night and she has been having difficulty sleeping. Pt tried Tylenol cold medication to alleviate her symptoms with no relief. She reports she has been out of her albuterol inhaler which typically helps her. She has been around her niece who had a cold. She denies chills and diarrhea.  Past Medical History  Diagnosis Date  . Hypertension   . Bronchitis    Past Surgical History  Procedure Laterality Date  . Tubal ligation     Family History  Problem Relation Age of Onset  . Hypertension Mother   . Diabetes Other    Social History  Substance Use Topics  . Smoking status: Former Smoker -- 2.00 packs/day    Types: Cigarettes    Quit date: 07/31/2015  . Smokeless tobacco: Never Used  . Alcohol Use: Yes   OB History    No data available     Review of Systems  Constitutional: Positive for fever. Negative for chills.  Respiratory: Positive for cough and shortness of breath.   Gastrointestinal: Positive for vomiting. Negative for diarrhea.      Allergies  Amoxicillin; Morphine and related; and Penicillins  Home Medications    Prior to Admission medications   Medication Sig Start Date End Date Taking? Authorizing Provider  acetaminophen (TYLENOL) 500 MG tablet Take 1,000 mg by mouth every 6 (six) hours as needed for pain.    Historical Provider, MD  albuterol (PROVENTIL HFA;VENTOLIN HFA) 108 (90 Base) MCG/ACT inhaler Inhale 1-2 puffs into the lungs every 6 (six) hours as needed for wheezing or shortness of breath. 10/28/15   Chase Picket Ward, PA-C  diphenhydrAMINE (BENADRYL) 25 mg capsule Take 50 mg by mouth every 6 (six) hours as needed for allergies.     Historical Provider, MD  HYDROcodone-homatropine (HYCODAN) 5-1.5 MG/5ML syrup Take 5 mLs by mouth every 6 (six) hours as needed for cough. 10/28/15   Jaime Pilcher Ward, PA-C  ibuprofen (ADVIL,MOTRIN) 800 MG tablet Take 1 tablet (800 mg total) by mouth 3 (three) times daily. 05/16/13   Elwin Mocha, MD   Triage vitals: BP 152/98 mmHg  Pulse 94  Temp(Src) 99 F (37.2 C) (Oral)  Resp 20  SpO2 98%  LMP 10/26/2015 Physical Exam  Constitutional: She is oriented to person, place, and time. She appears well-developed and well-nourished. No distress.  HENT:  Head: Normocephalic and atraumatic.  Nose: Mucosal edema and rhinorrhea present. Right sinus exhibits no maxillary sinus tenderness and no frontal sinus tenderness. Left sinus exhibits no maxillary sinus tenderness and no frontal sinus tenderness.  OP with erythema, no exudates  Neck: Normal range  of motion. Neck supple.  Cardiovascular: Normal rate, regular rhythm and normal heart sounds.   Pulmonary/Chest: Effort normal and breath sounds normal. No respiratory distress.  Musculoskeletal: Normal range of motion. She exhibits no edema.  Neurological: She is alert and oriented to person, place, and time. No sensory deficit.  Skin: Skin is warm and dry.  Psychiatric: She has a normal mood and affect. Her behavior is normal.  Nursing note and vitals reviewed.   ED Course  Procedures  DIAGNOSTIC  STUDIES: Oxygen Saturation is 98% on RA, normal by my interpretation.  COORDINATION OF CARE:  7:17 PM Discussed treatment plan which includes inhaler, mucinex and flonase with pt at bedside and pt agreed to plan.  Labs Review Labs Reviewed - No data to display  Imaging Review No results found. I have personally reviewed and evaluated these images and lab results as part of my medical decision-making.   EKG Interpretation None      MDM   Final diagnoses:  Upper respiratory infection   Melanie Castro is afebrile, non-toxic appearing with a clear lung exam. Mild rhinorrhea and OP with erythema, no exudates. Likely viral URI. Patient is agreeable to symptomatic treatment with close follow up with PCP as needed but spoke at length about emergent, changing, or worsening of symptoms that should prompt return to ER. Patient voices understanding and is agreeable to plan.   Blood pressure 143/97, pulse 68, temperature 98.7 F (37.1 C), temperature source Oral, resp. rate 18, last menstrual period 10/26/2015, SpO2 100 %.  I personally performed the services described in this documentation, which was scribed in my presence. The recorded information has been reviewed and is accurate.  Chase Picket Ward, PA-C 10/28/15 1932  Richardean Canal, MD 10/30/15 [phone]

## 2016-07-24 ENCOUNTER — Encounter (HOSPITAL_COMMUNITY): Payer: Self-pay | Admitting: Emergency Medicine

## 2016-07-24 ENCOUNTER — Emergency Department (HOSPITAL_COMMUNITY): Payer: Medicaid Other

## 2016-07-24 ENCOUNTER — Emergency Department (HOSPITAL_COMMUNITY)
Admission: EM | Admit: 2016-07-24 | Discharge: 2016-07-24 | Disposition: A | Discharge status: Home / Self Care | Payer: Medicaid Other | Attending: Emergency Medicine | Admitting: Emergency Medicine

## 2016-07-24 DIAGNOSIS — Z7982 Long term (current) use of aspirin: Secondary | ICD-10-CM | POA: Diagnosis not present

## 2016-07-24 DIAGNOSIS — S61411A Laceration without foreign body of right hand, initial encounter: Secondary | ICD-10-CM | POA: Diagnosis not present

## 2016-07-24 DIAGNOSIS — Y929 Unspecified place or not applicable: Secondary | ICD-10-CM | POA: Diagnosis not present

## 2016-07-24 DIAGNOSIS — F17210 Nicotine dependence, cigarettes, uncomplicated: Secondary | ICD-10-CM | POA: Insufficient documentation

## 2016-07-24 DIAGNOSIS — Y999 Unspecified external cause status: Secondary | ICD-10-CM | POA: Insufficient documentation

## 2016-07-24 DIAGNOSIS — I10 Essential (primary) hypertension: Secondary | ICD-10-CM | POA: Insufficient documentation

## 2016-07-24 DIAGNOSIS — Y9389 Activity, other specified: Secondary | ICD-10-CM | POA: Diagnosis not present

## 2016-07-24 HISTORY — DX: Cardiac arrhythmia, unspecified: I49.9

## 2016-07-24 MED ORDER — OXYCODONE-ACETAMINOPHEN 5-325 MG PO TABS
1.0000 | ORAL_TABLET | Freq: Once | ORAL | Status: AC
  Administered 2016-07-24: 1 via ORAL
  Filled 2016-07-24: qty 1

## 2016-07-24 MED ORDER — LIDOCAINE-EPINEPHRINE (PF) 2 %-1:200000 IJ SOLN
20.0000 mL | Freq: Once | INTRAMUSCULAR | Status: AC
  Administered 2016-07-24: 20 mL
  Filled 2016-07-24: qty 20

## 2016-07-24 MED ORDER — CEPHALEXIN 500 MG PO CAPS: 500.0000 mg | ORAL_CAPSULE | Freq: Three times a day (TID) | ORAL | 0 refills | Status: DC

## 2016-07-24 MED ORDER — CEPHALEXIN 250 MG PO CAPS
500.0000 mg | ORAL_CAPSULE | Freq: Once | ORAL | Status: AC
  Administered 2016-07-24: 500 mg via ORAL
  Filled 2016-07-24: qty 2

## 2016-07-24 MED ORDER — NAPROXEN 250 MG PO TABS: 250.0000 mg | ORAL_TABLET | Freq: Two times a day (BID) | ORAL | 0 refills | Status: AC

## 2016-07-24 MED ORDER — BACITRACIN ZINC 500 UNIT/GM EX OINT: 1.0000 "application " | TOPICAL_OINTMENT | Freq: Two times a day (BID) | CUTANEOUS | 1 refills | Status: AC

## 2016-07-24 NOTE — ED Notes (Signed)
Pt left her tazer in department. Placed in security office.
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
## 2016-07-24 NOTE — Discharge Instructions (Signed)
Please follow up with hand surgery as we discussed.

## 2016-07-24 NOTE — ED Notes (Signed)
EDP at bedside suturing  
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
## 2019-06-11 ENCOUNTER — Other Ambulatory Visit: Payer: Self-pay

## 2019-06-11 ENCOUNTER — Encounter (HOSPITAL_COMMUNITY): Payer: Self-pay | Admitting: Emergency Medicine

## 2019-06-11 ENCOUNTER — Ambulatory Visit (HOSPITAL_COMMUNITY)
Admission: EM | Admit: 2019-06-11 | Discharge: 2019-06-11 | Disposition: A | Discharge status: Home / Self Care | Payer: Medicaid Other | Attending: Emergency Medicine | Admitting: Emergency Medicine

## 2019-06-11 DIAGNOSIS — M79642 Pain in left hand: Secondary | ICD-10-CM | POA: Diagnosis not present

## 2019-06-11 MED ORDER — IBUPROFEN 600 MG PO TABS: 600.0000 mg | ORAL_TABLET | Freq: Four times a day (QID) | ORAL | 0 refills | Status: AC | PRN

## 2019-06-11 NOTE — ED Provider Notes (Signed)
[place]    CSN: [id number] Arrival date & time: 06/11/19  1059      History   Chief Complaint Chief Complaint  Patient presents with  . Hand Pain    left    HPI Melanie Castro is a 38 y.o. female.   Patient presents with pain and swelling in her left hand since she woke up this morning.  She denies falls or injury.  She states she believes it is from using her hands at work.  She denies numbness, weakness, tingling in her fingers hand or arm.  She request a work note.  LMP: 05/27/2019.  The history is provided by the patient.    Past Medical History:  Diagnosis Date  . Bronchitis   . Hypertension   . Irregular heart rate     There are no active problems to display for this patient.   Past Surgical History:  Procedure Laterality Date  . TUBAL LIGATION      OB History   No obstetric history on file.      Home Medications    Prior to Admission medications   Medication Sig Start Date End Date Taking? Authorizing Provider  albuterol (PROVENTIL HFA;VENTOLIN HFA) 108 (90 Base) MCG/ACT inhaler Inhale 1-2 puffs into the lungs every 6 (six) hours as needed for wheezing or shortness of breath. 10/28/15  Yes Ward, Ozella Almond, PA-C  acetaminophen (TYLENOL) 500 MG tablet Take 1,000 mg by mouth every 6 (six) hours as needed for pain.    [provider]  aspirin-acetaminophen-caffeine (EXCEDRIN MIGRAINE) [phone] MG tablet Take 1-2 tablets by mouth every 6 (six) hours as needed for headache.    [provider]  bacitracin ointment Apply 1 application topically 2 (two) times daily. 07/24/16   Waynetta Pean, PA-C  cephALEXin (KEFLEX) 500 MG capsule Take 1 capsule (500 mg total) by mouth 3 (three) times daily. 07/24/16   Waynetta Pean, PA-C  diphenhydrAMINE (BENADRYL) 25 mg capsule Take 50 mg by mouth every 6 (six) hours as needed for allergies.     [provider]  ibuprofen (ADVIL) 600 MG tablet Take 1 tablet (600 mg total)  by mouth every 6 (six) hours as needed. 06/11/19   Sharion Balloon, NP  naproxen (NAPROSYN) 250 MG tablet Take 1 tablet (250 mg total) by mouth 2 (two) times daily with a meal. 07/24/16   Waynetta Pean, PA-C    Family History Family History  Problem Relation Age of Onset  . Hypertension Mother   . Diabetes Other     Social History Social History   Tobacco Use  . Smoking status: Current Every Day Smoker    Packs/day: 1.00    Types: Cigarettes    Last attempt to quit: 07/31/2015    Years since quitting: 3.8  . Smokeless tobacco: Never Used  Substance Use Topics  . Alcohol use: Yes  . Drug use: Yes    Types: Marijuana     Allergies   Amoxicillin, Morphine and related, and Penicillins   Review of Systems Review of Systems  Constitutional: Negative for chills and fever.  HENT: Negative for ear pain and sore throat.   Eyes: Negative for pain and visual disturbance.  Respiratory: Negative for cough and shortness of breath.   Cardiovascular: Negative for chest pain and palpitations.  Gastrointestinal: Negative for abdominal pain and vomiting.  Genitourinary: Negative for dysuria and hematuria.  Musculoskeletal: Positive for arthralgias. Negative for back pain.  Skin: Negative for color change and  rash.  Neurological: Negative for seizures, syncope, weakness and numbness.  All other systems reviewed and are negative.    Physical Exam Triage Vital Signs ED Triage Vitals [06/11/19 1155]  Enc Vitals Group     BP (!) 144/81     Pulse Rate 66     Resp 12     Temp 98.2 F (36.8 C)     Temp Source Oral     SpO2 100 %     Weight      Height      Head Circumference      Peak Flow      Pain Score 0     Pain Loc      Pain Edu?      Excl. in GC?    No data found.  Updated Vital Signs BP (!) 144/81 (BP Location: Right Arm)   Pulse 66   Temp 98.2 F (36.8 C) (Oral)   Resp 12   LMP 05/27/2019 (Approximate)   SpO2 100%   Visual Acuity Right Eye Distance:   Left  Eye Distance:   Bilateral Distance:    Right Eye Near:   Left Eye Near:    Bilateral Near:     Physical Exam Vitals signs and nursing note reviewed.  Constitutional:      General: She is not in acute distress.    Appearance: She is well-developed.  HENT:     Head: Normocephalic and atraumatic.  Eyes:     Conjunctiva/sclera: Conjunctivae normal.  Neck:     Musculoskeletal: Neck supple.  Cardiovascular:     Rate and Rhythm: Normal rate and regular rhythm.     Heart sounds: No murmur.  Pulmonary:     Effort: Pulmonary effort is normal. No respiratory distress.     Breath sounds: Normal breath sounds.  Abdominal:     Palpations: Abdomen is soft.     Tenderness: There is no abdominal tenderness.  Musculoskeletal: Normal range of motion.        General: Swelling and tenderness present. No deformity.     Comments: Mild edema in left hand.  Generalized mild tenderness to palpation of back of hand.  No point tenderness.  No erythema or ecchymosis.    Skin:    General: Skin is warm and dry.     Capillary Refill: Capillary refill takes less than 2 seconds.     Findings: No bruising, erythema, lesion or rash.  Neurological:     General: No focal deficit present.     Mental Status: She is alert and oriented to person, place, and time.     Sensory: No sensory deficit.     Motor: No weakness.      UC Treatments / Results  Labs (all labs ordered are listed, but only abnormal results are displayed) Labs Reviewed - No data to display  EKG   Radiology No results found.  Procedures Procedures (including critical care time)  Medications Ordered in UC Medications - No data to display  Initial Impression / Assessment and Plan / UC Course  I have reviewed the triage vital signs and the nursing notes.  Pertinent labs & imaging results that were available during my care of the patient were reviewed by me and considered in my medical decision making (see chart for details).     Left hand pain.  Treating with RICE, ibuprofen, ace wrap.  Instructed patient to follow-up with her PCP or orthopedic as needed if her pain continues or worsens;  Or if she develops other symptoms such as paresthesias or weakness.  Patient agrees with plan of care.     Final Clinical Impressions(s) / UC Diagnoses   Final diagnoses:  Left hand pain     Discharge Instructions     Take the prescribed ibuprofen as needed for discomfort.    Rest and elevate your hand as you are able.  Apply ice packs 2-3 times a day for up to 20 minutes each.  Wear the Ace wrap as needed for comfort.    Follow-up with the orthopedic listed if your symptoms persist or worsen.       ED Prescriptions    Medication Sig Dispense Auth. Provider   ibuprofen (ADVIL) 600 MG tablet Take 1 tablet (600 mg total) by mouth every 6 (six) hours as needed. 30 tablet Mickie Bailate, Jahniyah Revere H, NP     Controlled Substance Prescriptions [place] Controlled Substance Registry consulted? Not Applicable   Mickie Bailate, Samiel Peel H, NP 06/11/19 1216

## 2019-06-11 NOTE — Discharge Instructions (Addendum)
Take the prescribed ibuprofen as needed for discomfort.    Rest and elevate your hand as you are able.  Apply ice packs 2-3 times a day for up to 20 minutes each.  Wear the Ace wrap as needed for comfort.    Follow-up with the orthopedic listed if your symptoms persist or worsen.

## 2019-06-11 NOTE — ED Triage Notes (Signed)
Pt reports waking up with left hand swelling and pain.  She states she did not get much sleep due to the discomfort in her hand.  She denies any injury to the hand.

## 2019-06-24 ENCOUNTER — Other Ambulatory Visit: Payer: Self-pay

## 2019-06-24 DIAGNOSIS — Z20822 Contact with and (suspected) exposure to covid-19: Secondary | ICD-10-CM

## 2019-06-25 LAB — NOVEL CORONAVIRUS, NAA: SARS-CoV-2, NAA: NOT DETECTED

## 2022-05-30 ENCOUNTER — Ambulatory Visit (INDEPENDENT_AMBULATORY_CARE_PROVIDER_SITE_OTHER): Payer: Medicaid Other

## 2022-05-30 ENCOUNTER — Encounter (HOSPITAL_COMMUNITY): Payer: Self-pay | Admitting: *Deleted

## 2022-05-30 ENCOUNTER — Ambulatory Visit (HOSPITAL_COMMUNITY)
Admission: EM | Admit: 2022-05-30 | Discharge: 2022-05-30 | Disposition: A | Discharge status: Home / Self Care | Payer: Medicaid Other | Attending: Family Medicine | Admitting: Family Medicine

## 2022-05-30 DIAGNOSIS — W19XXXA Unspecified fall, initial encounter: Secondary | ICD-10-CM

## 2022-05-30 DIAGNOSIS — S99922A Unspecified injury of left foot, initial encounter: Secondary | ICD-10-CM

## 2022-05-30 DIAGNOSIS — T148XXA Other injury of unspecified body region, initial encounter: Secondary | ICD-10-CM | POA: Diagnosis not present

## 2022-05-30 DIAGNOSIS — S92902A Unspecified fracture of left foot, initial encounter for closed fracture: Secondary | ICD-10-CM | POA: Diagnosis not present

## 2022-05-30 DIAGNOSIS — M79672 Pain in left foot: Secondary | ICD-10-CM | POA: Diagnosis not present

## 2022-05-30 MED ORDER — HYDROCODONE-ACETAMINOPHEN 5-325 MG PO TABS: 2.0000 | ORAL_TABLET | Freq: Every day | ORAL | 0 refills | Status: AC

## 2022-05-30 NOTE — ED Provider Notes (Signed)
[facility]    CSN: [id number] Arrival date & time: 05/30/22  7741      History   Chief Complaint Chief Complaint  Patient presents with   Fall    HPI Melanie Castro is a 41 y.o. female.   2 days ago she was racing her kids, and fell.  She scraped her knees.  The next day she was unable to bear weight on the left foot/ankle.  She could not sleep last night due to pain.  Having swelling to the foot,  mostly just before the toes.  She did take tylenol for pain.  Fells on the knees.  The right knee is still "leaking".    Past Medical History:  Diagnosis Date   Bronchitis    Hypertension    Irregular heart rate     There are no problems to display for this patient.   Past Surgical History:  Procedure Laterality Date   TUBAL LIGATION      OB History   No obstetric history on file.      Home Medications    Prior to Admission medications   Medication Sig Start Date End Date Taking? Authorizing Provider  acetaminophen (TYLENOL) 500 MG tablet Take 1,000 mg by mouth every 6 (six) hours as needed for pain.   Yes [provider]  albuterol (PROVENTIL HFA;VENTOLIN HFA) 108 (90 Base) MCG/ACT inhaler Inhale 1-2 puffs into the lungs every 6 (six) hours as needed for wheezing or shortness of breath. 10/28/15   Ward, Chase Picket, PA-C  aspirin-acetaminophen-caffeine (EXCEDRIN MIGRAINE) [phone] MG tablet Take 1-2 tablets by mouth every 6 (six) hours as needed for headache.    [provider]  bacitracin ointment Apply 1 application topically 2 (two) times daily. 07/24/16   Everlene Farrier, PA-C  cephALEXin (KEFLEX) 500 MG capsule Take 1 capsule (500 mg total) by mouth 3 (three) times daily. 07/24/16   Everlene Farrier, PA-C  diphenhydrAMINE (BENADRYL) 25 mg capsule Take 50 mg by mouth every 6 (six) hours as needed for allergies.     [provider]  ibuprofen (ADVIL) 600 MG tablet Take 1 tablet (600 mg total) by mouth every 6 (six)  hours as needed. 06/11/19   Mickie Bail, NP  naproxen (NAPROSYN) 250 MG tablet Take 1 tablet (250 mg total) by mouth 2 (two) times daily with a meal. 07/24/16   Everlene Farrier, PA-C    Family History Family History  Problem Relation Age of Onset   Hypertension Mother    Diabetes Other     Social History Social History   Tobacco Use   Smoking status: Every Day    Packs/day: 1.00    Types: Cigarettes    Last attempt to quit: 07/31/2015    Years since quitting: 6.8   Smokeless tobacco: Never  Substance Use Topics   Alcohol use: Yes   Drug use: Yes    Types: Marijuana     Allergies   Amoxicillin, Morphine and related, and Penicillins   Review of Systems Review of Systems  Constitutional: Negative.   HENT: Negative.    Respiratory: Negative.    Cardiovascular: Negative.   Gastrointestinal: Negative.   Genitourinary: Negative.   Musculoskeletal:  Positive for joint swelling.  Skin:  Positive for wound.     Physical Exam Triage Vital Signs ED Triage Vitals  Enc Vitals Group     BP 05/30/22 0925 (!) 154/96     Pulse Rate 05/30/22 0925 77  Resp 05/30/22 0925 18     Temp 05/30/22 0925 97.9 F (36.6 C)     Temp Source 05/30/22 0925 Oral     SpO2 05/30/22 0925 99 %     Weight --      Height --      Head Circumference --      Peak Flow --      Pain Score 05/30/22 0923 8     Pain Loc --      Pain Edu? --      Excl. in GC? --    No data found.  Updated Vital Signs BP (!) 154/96 (BP Location: Left Arm)   Pulse 77   Temp 97.9 F (36.6 C) (Oral)   Resp 18   LMP 05/01/2022 (Approximate)   SpO2 99%   Visual Acuity Right Eye Distance:   Left Eye Distance:   Bilateral Distance:    Right Eye Near:   Left Eye Near:    Bilateral Near:     Physical Exam Constitutional:      Appearance: Normal appearance.  Musculoskeletal:     Comments: Swelling to the left 1st MTP joint;  very TTP at this time;  No other TTP to the foot/ankle/toes;  Skin:     Comments: Abrasion at the right knee;  no active bleeding noted;  skin is pink  Neurological:     General: No focal deficit present.     Mental Status: She is alert.  Psychiatric:        Mood and Affect: Mood normal.      UC Treatments / Results  Labs (all labs ordered are listed, but only abnormal results are displayed) Labs Reviewed - No data to display  EKG   Radiology DG Foot Complete Left  Result Date: 05/30/2022 CLINICAL DATA:  41 year old female status post fall while running. Left foot swelling, unable to weightbear. EXAM: LEFT FOOT - COMPLETE 3+ VIEW COMPARISON:  None Available. FINDINGS: Generalized soft tissue swelling of the distal left foot. On the lateral view there is a small dorsal fracture fragment at the base of a proximal phalanx which is probably at the great toe (see also image #2). Joint space and alignment appears maintained. Head of the 1st metatarsal appears to remain intact with some degenerative spurring. No other No acute osseous abnormality identified. IMPRESSION: 1. Small dorsal intra-articular fracture at the base of the 1st proximal phalanx. 2. No other acute fracture or dislocation identified about the left foot. Electronically Signed   By: Odessa Fleming M.D.   On: 05/30/2022 09:54    Procedures Procedures (including critical care time)  Medications Ordered in UC Medications - No data to display  Initial Impression / Assessment and Plan / UC Course  I have reviewed the triage vital signs and the nursing notes.  Pertinent labs & imaging results that were available during my care of the patient were reviewed by me and considered in my medical decision making (see chart for details).    Final Clinical Impressions(s) / UC Diagnoses   Final diagnoses:  Fall, initial encounter  Skin abrasion  Foot injury, left, initial encounter  Closed fracture of left foot, initial encounter     Discharge Instructions      You were seen today for foot pain, and it  appears you have a small fracture at the base of the big toe.  We have put you in a post op shoe today for comfort.   You should take 800mg   motrin three times/day with food.  I have sent out a few pain meds to your pharmacy to help with pain at night.  You should follow up with a podiatrist for this by calling [facility] at [phone].     ED Prescriptions     Medication Sig Dispense Auth. Provider   HYDROcodone-acetaminophen (NORCO/VICODIN) 5-325 MG tablet Take 2 tablets by mouth at bedtime. 6 tablet Jannifer Franklin, MD      PDMP not reviewed this encounter.   Jannifer Franklin, MD 05/30/22 1011

## 2022-05-30 NOTE — ED Triage Notes (Signed)
Pt state that she was running with the kids and fell. She she scrapes on knees, her left foot is swollen and she cant put weight on it. She is taking tylenol but it is not helping.

## 2022-05-30 NOTE — Discharge Instructions (Signed)
You were seen today for foot pain, and it appears you have a small fracture at the base of the big toe.  We have put you in a post op shoe today for comfort.   You should take 800mg  motrin three times/day with food.  I have sent out a few pain meds to your pharmacy to help with pain at night.  You should follow up with a podiatrist for this by calling [facility] at [phone].

## 2022-06-12 ENCOUNTER — Ambulatory Visit (INDEPENDENT_AMBULATORY_CARE_PROVIDER_SITE_OTHER): Payer: Medicaid Other

## 2022-06-12 ENCOUNTER — Ambulatory Visit (INDEPENDENT_AMBULATORY_CARE_PROVIDER_SITE_OTHER): Payer: Medicaid Other | Admitting: Podiatry

## 2022-06-12 DIAGNOSIS — M7752 Other enthesopathy of left foot: Secondary | ICD-10-CM | POA: Diagnosis not present

## 2022-06-12 DIAGNOSIS — S99922A Unspecified injury of left foot, initial encounter: Secondary | ICD-10-CM

## 2022-06-12 MED ORDER — MELOXICAM 15 MG PO TABS: 15.0000 mg | ORAL_TABLET | Freq: Every day | ORAL | 2 refills | Status: AC

## 2022-06-12 MED ORDER — TRIAMCINOLONE ACETONIDE 10 MG/ML IJ SUSP
10.0000 mg | Freq: Once | INTRAMUSCULAR | Status: AC
  Administered 2022-06-12: 10 mg

## 2022-06-13 ENCOUNTER — Other Ambulatory Visit: Payer: Self-pay | Admitting: Podiatry

## 2022-06-13 DIAGNOSIS — M779 Enthesopathy, unspecified: Secondary | ICD-10-CM

## 2022-06-14 NOTE — Progress Notes (Signed)
Subjective:   Patient ID: Melanie Castro, female   DOB: 41 y.o.   MRN: [id number]   HPI Patient presents stating that she fell on her left foot and thinks maybe she broke a bone on the side.  Patient states her knee gave out and she fell to the ground and that its been inflamed and sore to walk on.  This occurred approximately 5 days ago and patient does not smoke currently and likes to be active   Review of Systems  All other systems reviewed and are negative.       Objective:  Physical Exam Vitals and nursing note reviewed.  Constitutional:      Appearance: She is well-developed.  Pulmonary:     Effort: Pulmonary effort is normal.  Musculoskeletal:        General: Normal range of motion.  Skin:    General: Skin is warm.  Neurological:     Mental Status: She is alert.     Neurovascular status was found to be intact muscle strength adequate range of motion subtalar midtarsal joint was found to be intact.  Patient has diminished range of motion of the first MPJ left with inflammation around the joint surface moderate swelling in this area where she may have hyperextended the toe.  She has good digital perfusion well oriented x3 with no other observable foot injuries with quite a bit of irritation of the right knee secondary to the fall     Assessment:  Inflammatory complex develop from trauma     Plan:  H&P x-ray reviewed.  At this point I do think were dealing with an inflammatory issue and I did a careful periarticular injection around the first MPJ 3 mg dexamethasone Kenalog 5 mg Xylocaine advised on rigid bottom shoe and surgical shoe that he has and she may need a boot and I gave her instructions if its not improving on purchasing the boot off of [employer].  I explained this will probably take a period to heal as with her other injuries and patient will be seen back as symptoms indicate  X-rays are negative for signs of a fracture around this area it appears to be more of  an inflammatory mechanism currently and may need to be reevaluated in the future if symptoms continue

## 2024-01-10 ENCOUNTER — Other Ambulatory Visit: Payer: Self-pay

## 2024-01-10 ENCOUNTER — Emergency Department (HOSPITAL_COMMUNITY)
Admission: EM | Admit: 2024-01-10 | Discharge: 2024-01-10 | Disposition: A | Discharge status: Home / Self Care | Payer: Self-pay | Attending: Emergency Medicine | Admitting: Emergency Medicine

## 2024-01-10 DIAGNOSIS — R109 Unspecified abdominal pain: Secondary | ICD-10-CM

## 2024-01-10 DIAGNOSIS — N3000 Acute cystitis without hematuria: Secondary | ICD-10-CM | POA: Insufficient documentation

## 2024-01-10 DIAGNOSIS — R112 Nausea with vomiting, unspecified: Secondary | ICD-10-CM | POA: Insufficient documentation

## 2024-01-10 LAB — URINALYSIS, ROUTINE W REFLEX MICROSCOPIC
Bilirubin Urine: NEGATIVE
Glucose, UA: NEGATIVE mg/dL
Ketones, ur: 20 mg/dL — AB
Nitrite: POSITIVE — AB
Protein, ur: 100 mg/dL — AB
Specific Gravity, Urine: 1.021 (ref 1.005–1.030)
WBC, UA: >50 WBC/hpf (ref 0–5)
pH: 5 (ref 5.0–8.0)

## 2024-01-10 LAB — CBC
HCT: 41.2 % (ref 36.0–46.0)
Hemoglobin: 13.5 g/dL (ref 12.0–15.0)
MCH: 27.1 pg (ref 26.0–34.0)
MCHC: 32.8 g/dL (ref 30.0–36.0)
MCV: 82.6 fL (ref 80.0–100.0)
Platelets: 395 10*3/uL (ref 150–400)
RBC: 4.99 MIL/uL (ref 3.87–5.11)
RDW: 14.4 % (ref 11.5–15.5)
WBC: 10.6 10*3/uL — ABNORMAL HIGH (ref 4.0–10.5)
nRBC: 0 % (ref 0.0–0.2)

## 2024-01-10 LAB — COMPREHENSIVE METABOLIC PANEL WITH GFR
ALT: 13 U/L (ref 0–44)
AST: 19 U/L (ref 15–41)
Albumin: 4 g/dL (ref 3.5–5.0)
Alkaline Phosphatase: 51 U/L (ref 38–126)
Anion gap: 10 (ref 5–15)
BUN: 17 mg/dL (ref 6–20)
CO2: 25 mmol/L (ref 22–32)
Calcium: 8.8 mg/dL — ABNORMAL LOW (ref 8.9–10.3)
Chloride: 95 mmol/L — ABNORMAL LOW (ref 98–111)
Creatinine, Ser: 1.29 mg/dL — ABNORMAL HIGH (ref 0.44–1.00)
GFR, Estimated: 53 mL/min — ABNORMAL LOW (ref >60)
Glucose, Bld: 130 mg/dL — ABNORMAL HIGH (ref 70–99)
Potassium: 3 mmol/L — ABNORMAL LOW (ref 3.5–5.1)
Sodium: 130 mmol/L — ABNORMAL LOW (ref 135–145)
Total Bilirubin: 0.6 mg/dL (ref 0.0–1.2)
Total Protein: 8.8 g/dL — ABNORMAL HIGH (ref 6.5–8.1)

## 2024-01-10 LAB — HCG, SERUM, QUALITATIVE: Preg, Serum: NEGATIVE

## 2024-01-10 MED ORDER — ACETAMINOPHEN 500 MG PO TABS
1000.0000 mg | ORAL_TABLET | Freq: Once | ORAL | Status: AC
  Administered 2024-01-10: 1000 mg via ORAL
  Filled 2024-01-10: qty 2

## 2024-01-10 MED ORDER — CEPHALEXIN 500 MG PO CAPS: 500.0000 mg | ORAL_CAPSULE | Freq: Two times a day (BID) | ORAL | 0 refills | Status: AC

## 2024-01-10 MED ORDER — SODIUM CHLORIDE 0.9 % IV BOLUS
1000.0000 mL | Freq: Once | INTRAVENOUS | Status: AC
  Administered 2024-01-10: 1000 mL via INTRAVENOUS

## 2024-01-10 MED ORDER — ONDANSETRON HCL 4 MG/2ML IJ SOLN
4.0000 mg | Freq: Once | INTRAMUSCULAR | Status: AC
  Administered 2024-01-10: 4 mg via INTRAVENOUS
  Filled 2024-01-10: qty 2

## 2024-01-10 MED ORDER — SODIUM CHLORIDE 0.9 % IV SOLN
1.0000 g | Freq: Once | INTRAVENOUS | Status: AC
  Administered 2024-01-10: 1 g via INTRAVENOUS
  Filled 2024-01-10: qty 10

## 2024-01-10 MED ORDER — ONDANSETRON 4 MG PO TBDP: 4.0000 mg | ORAL_TABLET | Freq: Three times a day (TID) | ORAL | 0 refills | Status: AC | PRN

## 2024-01-10 MED ORDER — HYDROMORPHONE HCL 1 MG/ML IJ SOLN
0.5000 mg | Freq: Once | INTRAMUSCULAR | Status: DC
  Filled 2024-01-10: qty 1

## 2024-01-10 NOTE — ED Notes (Signed)
 ED Provider at bedside.

## 2024-01-10 NOTE — ED Triage Notes (Signed)
 Pt reports bilateral flank pain x 4 days, pain radaites into lower abdomen. Pain associated with nausea. Emesis x 4 PTA. Has been taking AZO and tylenol with some intermittent relief.

## 2024-01-10 NOTE — ED Provider Notes (Signed)
 [facility] Emergency Department Provider Note MRN:  [id number]  Arrival date & time: 01/10/24     Chief Complaint   Flank Pain   History of Present Illness   Melanie Castro is a 43 y.o. year-old female presents to the ED with chief complaint of lower abdominal pain that radiates into her bilateral flanks.  She reports associated nausea and vomiting.  She states that she has been feeling poorly for the past 3 or 4 days.  She states that this feels similar to when she had kidney infection many years ago.  She denies fevers or chills.  She states that she has tried taking AZO and Tylenol with some mild relief.  She denies having hematuria or dysuria.  Denies vaginal complaints.  Denies any other associated symptoms.  History provided by patient.   Review of Systems  Pertinent positive and negative review of systems noted in HPI.    Physical Exam   Vitals:   01/10/24 0200 01/10/24 0300  BP: 131/73 139/89  Pulse: 81 74  Resp:    Temp:    SpO2: 100% 100%    CONSTITUTIONAL:  non toxic-appearing, NAD NEURO:  Alert and oriented x 3, CN 3-12 grossly intact EYES:  eyes equal and reactive ENT/NECK:  Supple, no stridor  CARDIO:  tachycardic, regular rhythm, appears well-perfused  PULM:  No respiratory distress, CTAB GI/GU:  non-distended, generalized lower abdominal discomfort MSK/SPINE:  No gross deformities, no edema, moves all extremities  SKIN:  no rash, atraumatic   *Additional and/or pertinent findings included in MDM below  Diagnostic and Interventional Summary    EKG Interpretation Date/Time:    Ventricular Rate:    PR Interval:    QRS Duration:    QT Interval:    QTC Calculation:   R Axis:      Text Interpretation:         Labs Reviewed  URINALYSIS, ROUTINE W REFLEX MICROSCOPIC - Abnormal; Notable for the following components:      Result Value   Color, Urine AMBER (*)    APPearance TURBID (*)    Hgb urine dipstick SMALL (*)     Ketones, ur 20 (*)    Protein, ur 100 (*)    Nitrite POSITIVE (*)    Leukocytes,Ua LARGE (*)    Bacteria, UA MANY (*)    All other components within normal limits  COMPREHENSIVE METABOLIC PANEL WITH GFR - Abnormal; Notable for the following components:   Sodium 130 (*)    Potassium 3.0 (*)    Chloride 95 (*)    Glucose, Bld 130 (*)    Creatinine, Ser 1.29 (*)    Calcium 8.8 (*)    Total Protein 8.8 (*)    GFR, Estimated 53 (*)    All other components within normal limits  CBC - Abnormal; Notable for the following components:   WBC 10.6 (*)    All other components within normal limits  HCG, SERUM, QUALITATIVE    No orders to display    Medications  sodium chloride 0.9 % bolus 1,000 mL (0 mLs Intravenous Stopped 01/10/24 0308)  cefTRIAXone (ROCEPHIN) 1 g in sodium chloride 0.9 % 100 mL IVPB (0 g Intravenous Stopped 01/10/24 0208)  ondansetron (ZOFRAN) injection 4 mg (4 mg Intravenous Given 01/10/24 0230)  acetaminophen (TYLENOL) tablet 1,000 mg (1,000 mg Oral Given 01/10/24 0234)     Procedures  /  Critical Care Procedures  ED Course and Medical Decision Making  I have  reviewed the triage vital signs, the nursing notes, and pertinent available records from the EMR.  Social Determinants Affecting Complexity of Care: Patient has no clinically significant social determinants affecting this chief complaint..   ED Course: Clinical Course as of 01/10/24 0336  Sun Jan 10, 2024  0134 Urinalysis, Routine w reflex microscopic -Urine, Clean Catch(!) Urinalysis is consistent with UTI, suspicious for early pyelonephritis given patient's associated flank pain and nausea and vomiting [RB]  0135 CBC(!) Mild leukocytosis to 10.6 [RB]  0135 Comprehensive metabolic panel(!) Electrolytes are little low, will give fluid and oral potassium [RB]    Clinical Course User Index [RB] Sherel Dikes, PA-C    Medical Decision Making Patient here for bilateral flank pain and suprapubic pain.   She has had the symptoms for the past few days.  She has had nausea and vomiting that started today.  Patient is feeling significant improved after treatment in the ED.  She is tolerating oral intake.  I did offer admission, but patient is comfortable with trying outpatient treatment.  Will prescribe Keflex and Zofran.  Return precautions discussed.  Patient understands and agrees with the plan.  Amount and/or Complexity of Data Reviewed Labs: ordered. Decision-making details documented in ED Course.  Risk OTC drugs. Prescription drug management.         Consultants: No consultations were needed in caring for this patient.   Treatment and Plan: I considered admission due to patient's initial presentation, but after considering the examination and diagnostic results, patient will not require admission and can be discharged with outpatient follow-up.    Final Clinical Impressions(s) / ED Diagnoses     ICD-10-CM   1. Acute cystitis without hematuria  N30.00     2. Flank pain  R10.9       ED Discharge Orders          Ordered    cephALEXin (KEFLEX) 500 MG capsule  2 times daily        01/10/24 0334    ondansetron (ZOFRAN-ODT) 4 MG disintegrating tablet  Every 8 hours PRN        01/10/24 0334              Discharge Instructions Discussed with and Provided to Patient:   Discharge Instructions   None      Sherel Dikes, PA-C 01/10/24 1308    Long, Shereen Dike, MD 01/10/24 [phone]
